# Patient Record
Sex: Male | Born: 1995 | Race: White | Hispanic: No | Smoking: Former smoker
Health system: Southern US, Community
[De-identification: ages and names within clinical notes are randomized; demographics above are authoritative.]

## PROBLEM LIST (undated history)

## (undated) HISTORY — PX: WISDOM TOOTH EXTRACTION: SHX21

---

## 2014-10-28 ENCOUNTER — Emergency Department (HOSPITAL_BASED_OUTPATIENT_CLINIC_OR_DEPARTMENT_OTHER)
Admission: EM | Admit: 2014-10-28 | Discharge: 2014-10-29 | Disposition: A | Discharge status: Home / Self Care | Payer: Worker's Compensation | Attending: Emergency Medicine | Admitting: Emergency Medicine

## 2014-10-28 ENCOUNTER — Encounter (HOSPITAL_BASED_OUTPATIENT_CLINIC_OR_DEPARTMENT_OTHER): Payer: Self-pay | Admitting: *Deleted

## 2014-10-28 ENCOUNTER — Emergency Department (HOSPITAL_BASED_OUTPATIENT_CLINIC_OR_DEPARTMENT_OTHER): Payer: Worker's Compensation

## 2014-10-28 DIAGNOSIS — S29019A Strain of muscle and tendon of unspecified wall of thorax, initial encounter: Secondary | ICD-10-CM | POA: Insufficient documentation

## 2014-10-28 DIAGNOSIS — Y990 Civilian activity done for income or pay: Secondary | ICD-10-CM | POA: Insufficient documentation

## 2014-10-28 DIAGNOSIS — Y9389 Activity, other specified: Secondary | ICD-10-CM | POA: Diagnosis not present

## 2014-10-28 DIAGNOSIS — X58XXXA Exposure to other specified factors, initial encounter: Secondary | ICD-10-CM | POA: Insufficient documentation

## 2014-10-28 DIAGNOSIS — S4992XA Unspecified injury of left shoulder and upper arm, initial encounter: Secondary | ICD-10-CM | POA: Diagnosis present

## 2014-10-28 DIAGNOSIS — Y9289 Other specified places as the place of occurrence of the external cause: Secondary | ICD-10-CM | POA: Diagnosis not present

## 2014-10-28 DIAGNOSIS — Z79899 Other long term (current) drug therapy: Secondary | ICD-10-CM | POA: Insufficient documentation

## 2014-10-28 DIAGNOSIS — Z720 Tobacco use: Secondary | ICD-10-CM | POA: Diagnosis not present

## 2014-10-28 NOTE — ED Notes (Signed)
Pt was moving a heavy object tonight and his helper dropped his end causing th ept to be pulled back. Pt now has pain in his left shoulder and left upper back.

## 2014-10-28 NOTE — ED Notes (Signed)
UDS completed 

## 2014-10-29 ENCOUNTER — Emergency Department (HOSPITAL_BASED_OUTPATIENT_CLINIC_OR_DEPARTMENT_OTHER): Payer: Worker's Compensation

## 2014-10-29 MED ORDER — HYDROCODONE-ACETAMINOPHEN 5-325 MG PO TABS: 1.0000 | ORAL_TABLET | Freq: Four times a day (QID) | ORAL | Status: DC | PRN

## 2014-10-29 NOTE — ED Notes (Signed)
Patient transported to X-ray 

## 2014-10-29 NOTE — ED Notes (Signed)
MD at bedside. 

## 2014-10-29 NOTE — ED Provider Notes (Signed)
CSN: [id number]     Arrival date & time 10/28/14  2301 History   First MD Initiated Contact with Patient 10/29/14 0159     Chief Complaint  Patient presents with  . Shoulder Injury     (Consider location/radiation/quality/duration/timing/severity/associated sxs/prior Treatment) HPI  This is a 19 year old male who was at work yesterday evening about 9:15 PM. He was caring a heavy roll of paper on his left shoulder. The person behind him dropped his end causing the rolled come down forcefully. He felt up sudden pain in his left mid back. He now has focal pain in that region, worse with movement or deep breathing. He rates as a 2 out of 10 at rest but a 7 out of 10 when he moves or coughs. It is also worse when he turns his head to the right. He describes the pain is sharp and stabbing. He denies other injury.  History reviewed. No pertinent past medical history. Past Surgical History  Procedure Laterality Date  . Wisdom tooth extraction     No family history on file. History  Substance Use Topics  . Smoking status: Current Every Day Smoker  . Smokeless tobacco: Not on file  . Alcohol Use: No    Review of Systems  All other systems reviewed and are negative.   Allergies  Review of patient's allergies indicates no known allergies.  Home Medications   Prior to Admission medications   Medication Sig Start Date End Date Taking? Authorizing Provider  loratadine (CLARITIN) 10 MG tablet Take 10 mg by mouth daily.   Yes Historical Provider, MD   BP 139/67 mmHg  Pulse 64  Temp(Src) 98.3 F (36.8 C) (Oral)  Resp 16  Ht 6\' 2"  (1.88 m)  Wt 170 lb (77.111 kg)  BMI 21.82 kg/m2  SpO2 100%   Physical Exam General: Well-developed, well-nourished male in no acute distress; appearance consistent with age of record HENT: normocephalic; atraumatic Eyes: pupils equal, round and reactive to light; extraocular muscles intact Neck: supple; nontender; left posterior rib pain exacerbated by  rotation of head to right Heart: regular rate and rhythm Lungs: clear to auscultation bilaterally Chest: Left posterior mid thoracic rib tenderness without crepitus or deformity Abdomen: soft; nondistended Extremities: No deformity; full range of motion Neurologic: Awake, alert and oriented; motor function intact in all extremities and symmetric; no facial droop Skin: Warm and dry Psychiatric: Normal mood and affect    ED Course  Procedures (including critical care time)   MDM  Nursing notes and vitals signs, including pulse oximetry, reviewed.  Summary of this visit's results, reviewed by myself:  Imaging Studies: Dg Ribs Unilateral W/chest Left  10/29/2014   CLINICAL DATA:  Left mid posterior rib pain after injury. Pain radiates to left scapula.  EXAM: LEFT RIBS AND CHEST - 3+ VIEW  COMPARISON:  None.  FINDINGS: The cortical margins of the left ribs are intact. No fracture or destructive rib lesion. Both lungs are clear. The scapula where included is normal. No consolidation, pleural effusion or pneumothorax.  IMPRESSION: Intact left ribs, no fracture.   Electronically Signed   By: Rubye OaksMelanie  Ehinger M.D.   On: 10/29/2014 02:30   Dg Shoulder Left  10/29/2014   CLINICAL DATA:  Left shoulder pain after injury earlier this day. Pain in left shoulder radiates to left scapula.  EXAM: LEFT SHOULDER - 2+ VIEW  COMPARISON:  None.  FINDINGS: No fracture or dislocation. The alignment and joint spaces are maintained. Bone mineralization is normal. No  focal soft tissue abnormality.  IMPRESSION: Normal left shoulder radiographs.   Electronically Signed   By: Rubye Oaks M.D.   On: 10/29/2014 00:17       Paula Libra, MD 10/29/14 [phone]

## 2016-10-02 IMAGING — CR DG RIBS W/ CHEST 3+V*L*
3 series · 3 of 3 positions shown · non-contrast
Comparison: None.

CLINICAL DATA: Left mid posterior rib pain after injury. Pain
radiates to left scapula.

EXAM:
LEFT RIBS AND CHEST - 3+ VIEW

[w chest pa]
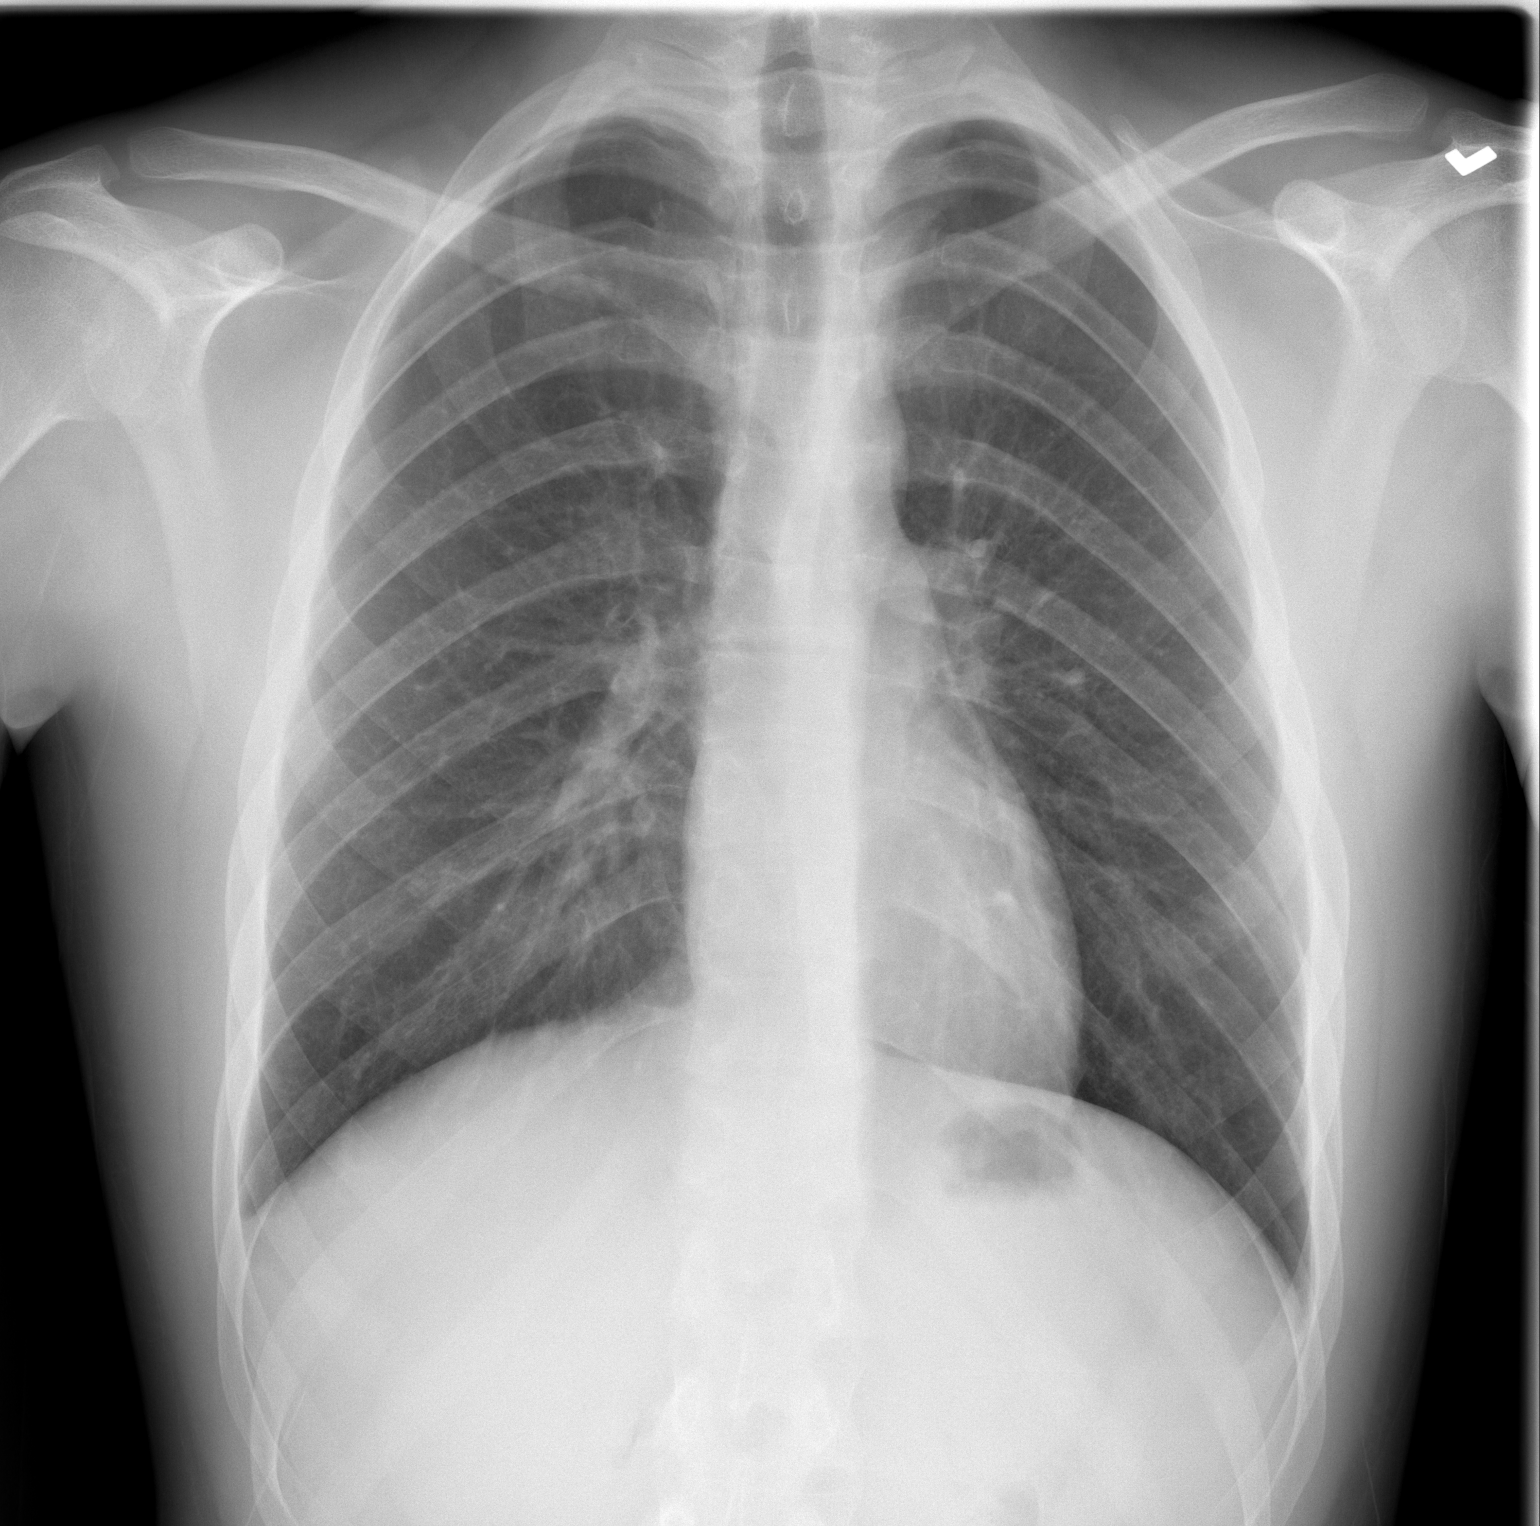

[w ribs ap/pa upper left]
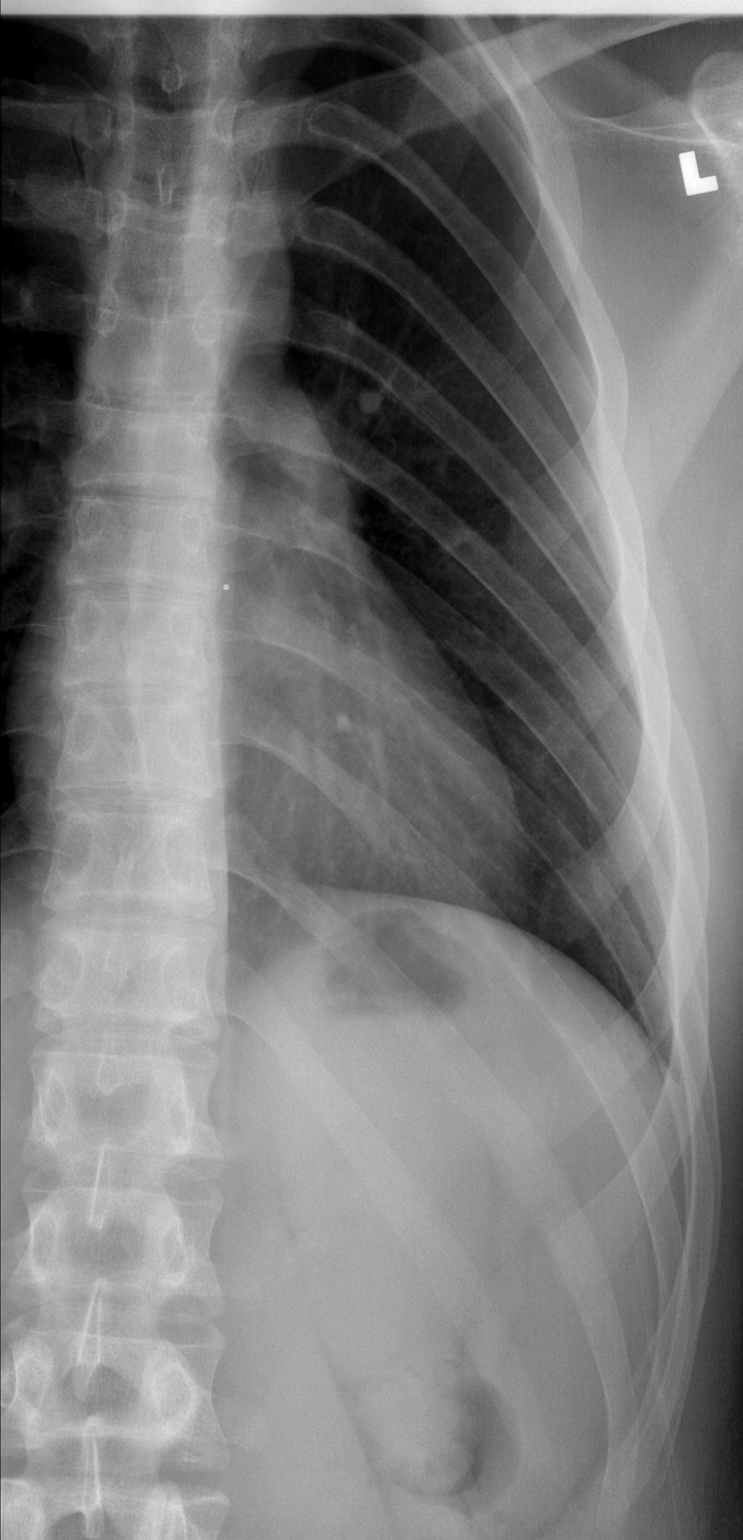

[w ribs oblique left]
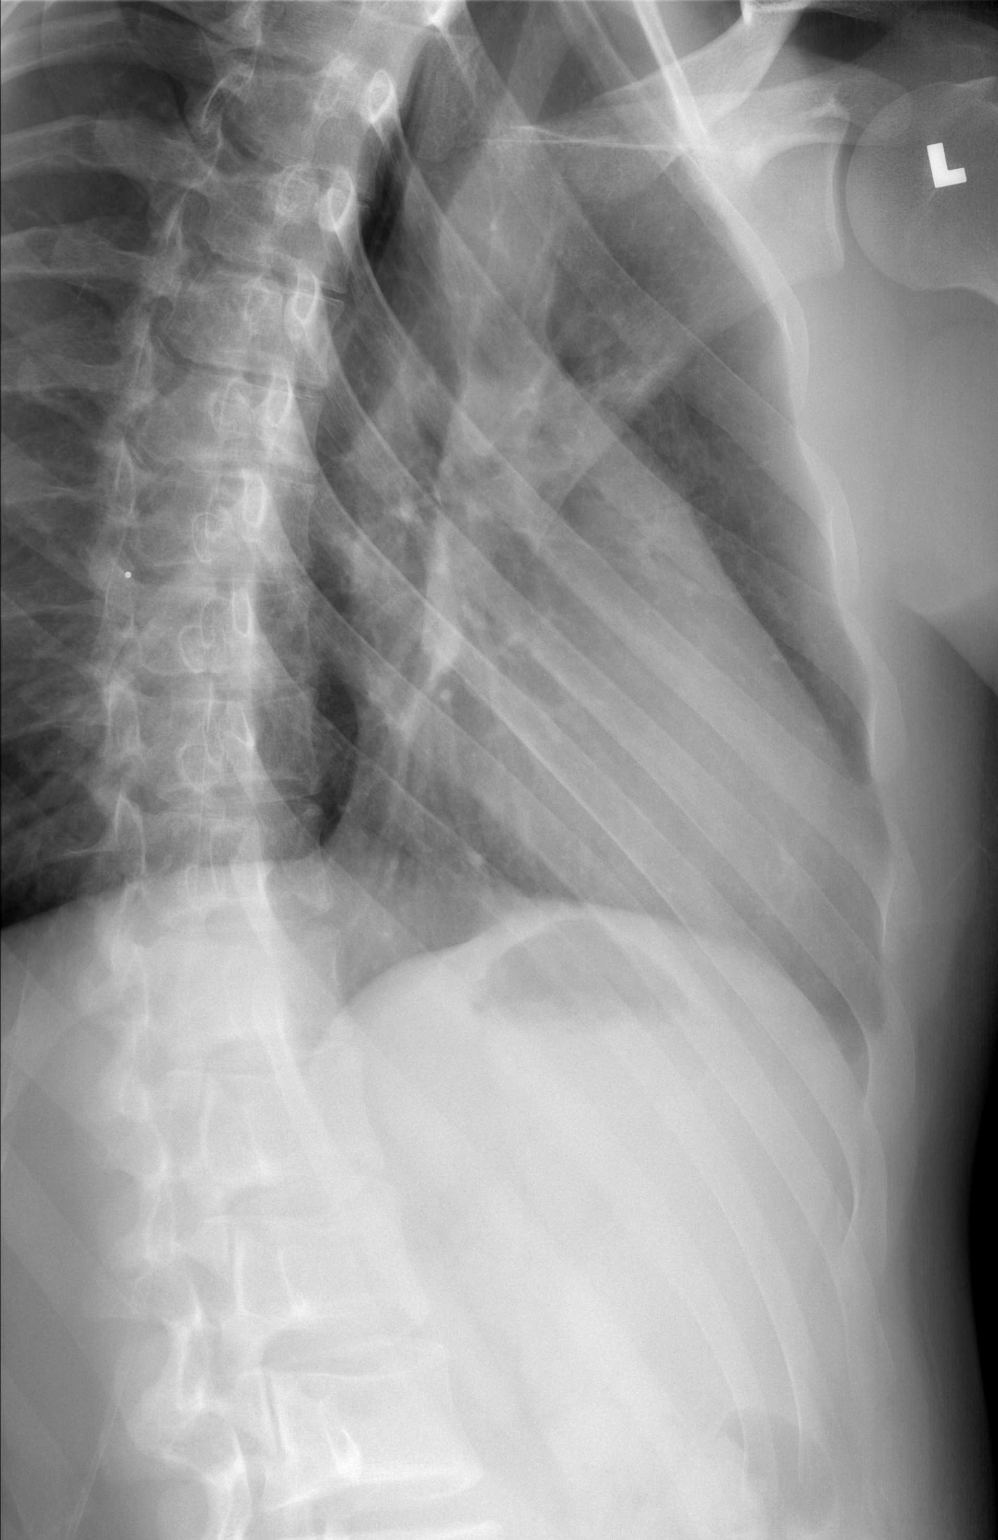

[3 of 3 positions shown; findings below may reference images not displayed]

FINDINGS: The cortical margins of the left ribs are intact. No fracture or
destructive rib lesion. Both lungs are clear. The scapula where
included is normal. No consolidation, pleural effusion or
pneumothorax.
IMPRESSION: Intact left ribs, no fracture.

## 2017-03-10 ENCOUNTER — Emergency Department (HOSPITAL_BASED_OUTPATIENT_CLINIC_OR_DEPARTMENT_OTHER)
Admission: EM | Admit: 2017-03-10 | Discharge: 2017-03-10 | Disposition: A | Discharge status: Home / Self Care | Payer: Worker's Compensation | Attending: Emergency Medicine | Admitting: Emergency Medicine

## 2017-03-10 ENCOUNTER — Encounter (HOSPITAL_BASED_OUTPATIENT_CLINIC_OR_DEPARTMENT_OTHER): Payer: Self-pay

## 2017-03-10 ENCOUNTER — Emergency Department (HOSPITAL_BASED_OUTPATIENT_CLINIC_OR_DEPARTMENT_OTHER): Payer: Worker's Compensation

## 2017-03-10 DIAGNOSIS — S61411A Laceration without foreign body of right hand, initial encounter: Secondary | ICD-10-CM | POA: Diagnosis not present

## 2017-03-10 DIAGNOSIS — Y929 Unspecified place or not applicable: Secondary | ICD-10-CM | POA: Diagnosis not present

## 2017-03-10 DIAGNOSIS — Y990 Civilian activity done for income or pay: Secondary | ICD-10-CM | POA: Diagnosis not present

## 2017-03-10 DIAGNOSIS — F17220 Nicotine dependence, chewing tobacco, uncomplicated: Secondary | ICD-10-CM | POA: Diagnosis not present

## 2017-03-10 DIAGNOSIS — Y939 Activity, unspecified: Secondary | ICD-10-CM | POA: Diagnosis not present

## 2017-03-10 DIAGNOSIS — W231XXA Caught, crushed, jammed, or pinched between stationary objects, initial encounter: Secondary | ICD-10-CM | POA: Diagnosis not present

## 2017-03-10 DIAGNOSIS — S6991XA Unspecified injury of right wrist, hand and finger(s), initial encounter: Secondary | ICD-10-CM | POA: Diagnosis present

## 2017-03-10 MED ORDER — LIDOCAINE HCL 2 % IJ SOLN
10.0000 mL | Freq: Once | INTRAMUSCULAR | Status: AC
  Administered 2017-03-10: 200 mg
  Filled 2017-03-10: qty 20

## 2017-03-10 NOTE — ED Triage Notes (Signed)
Pt c/o crush injury to right hand at work approx 4pm-lac to thumb and web/abrasion with swelling to knuckle-NAD-steady gait

## 2017-03-10 NOTE — Discharge Instructions (Signed)
Please have her sutures removed in 7 days. Please keep your wound covered for the first 24 hours with a bandage. After 24 hours, you can remove the bandage and keep the area clean with warm soap and water. It also apply a topical antibiotic like bacitracin or Neosporin to the wound. Please keep the stitches covered with a bandage while your work or if there is any chance that the wound can get dirty.   If the wound becomes red, hot, swollen, or if you develop fever chills, please return to the emergency department for re-evaluation.

## 2017-03-10 NOTE — ED Notes (Signed)
ED Provider at bedside suturing hand.

## 2017-03-10 NOTE — ED Provider Notes (Signed)
[facility]   CSN: [id number] Arrival date & time: 03/10/17  1705     History   Chief Complaint Chief Complaint  Patient presents with  . Hand Injury    HPI Karl Medina is a [age] y.o. male who presents to the emergency department complaining of right hand pain that began suddenly at 16:00 while he was at work. He reports his hand was crushed between two pallets. No h/o of previous injuries. The patient is right-hand dominant. No numbness or weakness. He reports he tetanus is UTD. No pertinent PMH.   The history is provided by the patient. No language interpreter was used.    History reviewed. No pertinent past medical history.  There are no active problems to display for this patient.   Past Surgical History:  Procedure Laterality Date  . WISDOM TOOTH EXTRACTION         Home Medications    Prior to Admission medications   Not on File    Family History No family history on file.  Social History Social History  Substance Use Topics  . Smoking status: Former Games developermoker  . Smokeless tobacco: Current User    Types: Chew  . Alcohol use Yes     Comment: occ     Allergies   Patient has no known allergies.   Review of Systems Review of Systems  Skin: Positive for wound.  Allergic/Immunologic: Negative for immunocompromised state.  Neurological: Negative for weakness and numbness.     Physical Exam Updated Vital Signs BP 131/82 (BP Location: Left Arm)   Pulse 78   Temp 98.3 F (36.8 C) (Oral)   Resp 20   Ht 6' (1.829 m)   Wt 98 kg (216 lb)   SpO2 100%   BMI 29.29 kg/m   Physical Exam  Constitutional: He appears well-developed.  HENT:  Head: Normocephalic.  Eyes: Conjunctivae are normal.  Neck: Neck supple.  Cardiovascular: Normal rate and regular rhythm.   No murmur heard. Pulmonary/Chest: Effort normal.  Abdominal: Soft. He exhibits no distension.  Musculoskeletal:  Good strength against resistance of all  digits of the right hand. Good abduction and adduction, flexion, and extension of all digits. Sensation is intact throughout. Radial pulses 2+ bilaterally.   Neurological: He is alert.  Skin: Skin is warm and dry.  0.5 cm straight hemostatic laceration to the right hand on the lateral aspects at the base of the second digit on the palmar aspect. There is a superficial skin tear to the right thumb just inferior to the nail bed. No obvious foreign bodies.  Psychiatric: His behavior is normal.  Nursing note and vitals reviewed.    ED Treatments / Results  Labs (all labs ordered are listed, but only abnormal results are displayed) Labs Reviewed - No data to display  EKG  EKG Interpretation None       Radiology Dg Hand Complete Right  Result Date: 03/10/2017 CLINICAL DATA:  Right hand pain, crush injury EXAM: RIGHT HAND - COMPLETE 3+ VIEW COMPARISON:  None. FINDINGS: No fracture or dislocation is seen. The joint spaces are preserved. Visualized soft tissues are within normal limits. IMPRESSION: Negative. Electronically Signed   By: Charline BillsSriyesh  Krishnan M.D.   On: 03/10/2017 17:36    Procedures .Marland Kitchen.Laceration Repair Date/Time: 03/10/2017 7:30 PM Performed by: Lilian KapurMCDONALD, MIA A Authorized by: Frederik PearMCDONALD, MIA A   Consent:    Consent obtained:  Verbal   Consent given by:  Patient   Risks discussed:  Infection, pain, nerve damage, vascular damage and tendon damage   Alternatives discussed:  No treatment Anesthesia (see MAR for exact dosages):    Anesthesia method:  Local infiltration   Local anesthetic:  Lidocaine 2% w/o epi Laceration details:    Location: right hand    Length (cm):  0.5 Repair type:    Repair type:  Simple Pre-procedure details:    Preparation:  Patient was prepped and draped in usual sterile fashion and imaging obtained to evaluate for foreign bodies Exploration:    Hemostasis achieved with:  Direct pressure   Wound exploration: wound explored through full range of  motion and entire depth of wound probed and visualized     Wound extent: no fascia violation noted, no foreign bodies/material noted, no muscle damage noted, no nerve damage noted, no tendon damage noted, no underlying fracture noted and no vascular damage noted     Contaminated: no   Treatment:    Area cleansed with:  Betadine and saline   Amount of cleaning:  Standard   Irrigation solution:  Sterile saline   Irrigation method:  Pressure wash and syringe Skin repair:    Repair method:  Sutures   Suture size:  4-0   Suture material:  Prolene   Suture technique:  Simple interrupted (1 simple interrupted; 1 horizontal mattress )   Number of sutures:  2 Approximation:    Approximation:  Close Post-procedure details:    Dressing:  Antibiotic ointment, sterile dressing and adhesive bandage   Patient tolerance of procedure:  Tolerated well, no immediate complications   (including critical care time)  Medications Ordered in ED Medications  lidocaine (XYLOCAINE) 2 % (with pres) injection 200 mg (200 mg Infiltration Given 03/10/17 1900)     Initial Impression / Assessment and Plan / ED Course  I have reviewed the triage vital signs and the nursing notes.  Pertinent labs & imaging results that were available during my care of the patient were reviewed by me and considered in my medical decision making (see chart for details).     Pressure irrigation performed. Laceration occurred < 8 hours prior to repair which was well tolerated. Pt has no co morbidities to effect normal wound healing. Discussed suture home care w pt and answered questions. Pt to f-u for wound check and suture removal in 7 days. Pt is hemodynamically stable w no complaints prior to dc.     Final Clinical Impressions(s) / ED Diagnoses   Final diagnoses:  Laceration of right hand without foreign body, initial encounter    New Prescriptions New Prescriptions   No medications on file     Barkley BoardsMcDonald, Mia A,  PA-C 03/10/17 1933    Shaune PollackIsaacs, Cameron, MD 03/11/17 1622

## 2019-02-12 IMAGING — CR DG HAND COMPLETE 3+V*R*
3 series · 3 of 3 positions shown · non-contrast
Comparison: None.

CLINICAL DATA: Right hand pain, crush injury

EXAM:
RIGHT HAND - COMPLETE 3+ VIEW

[x hand pa right]
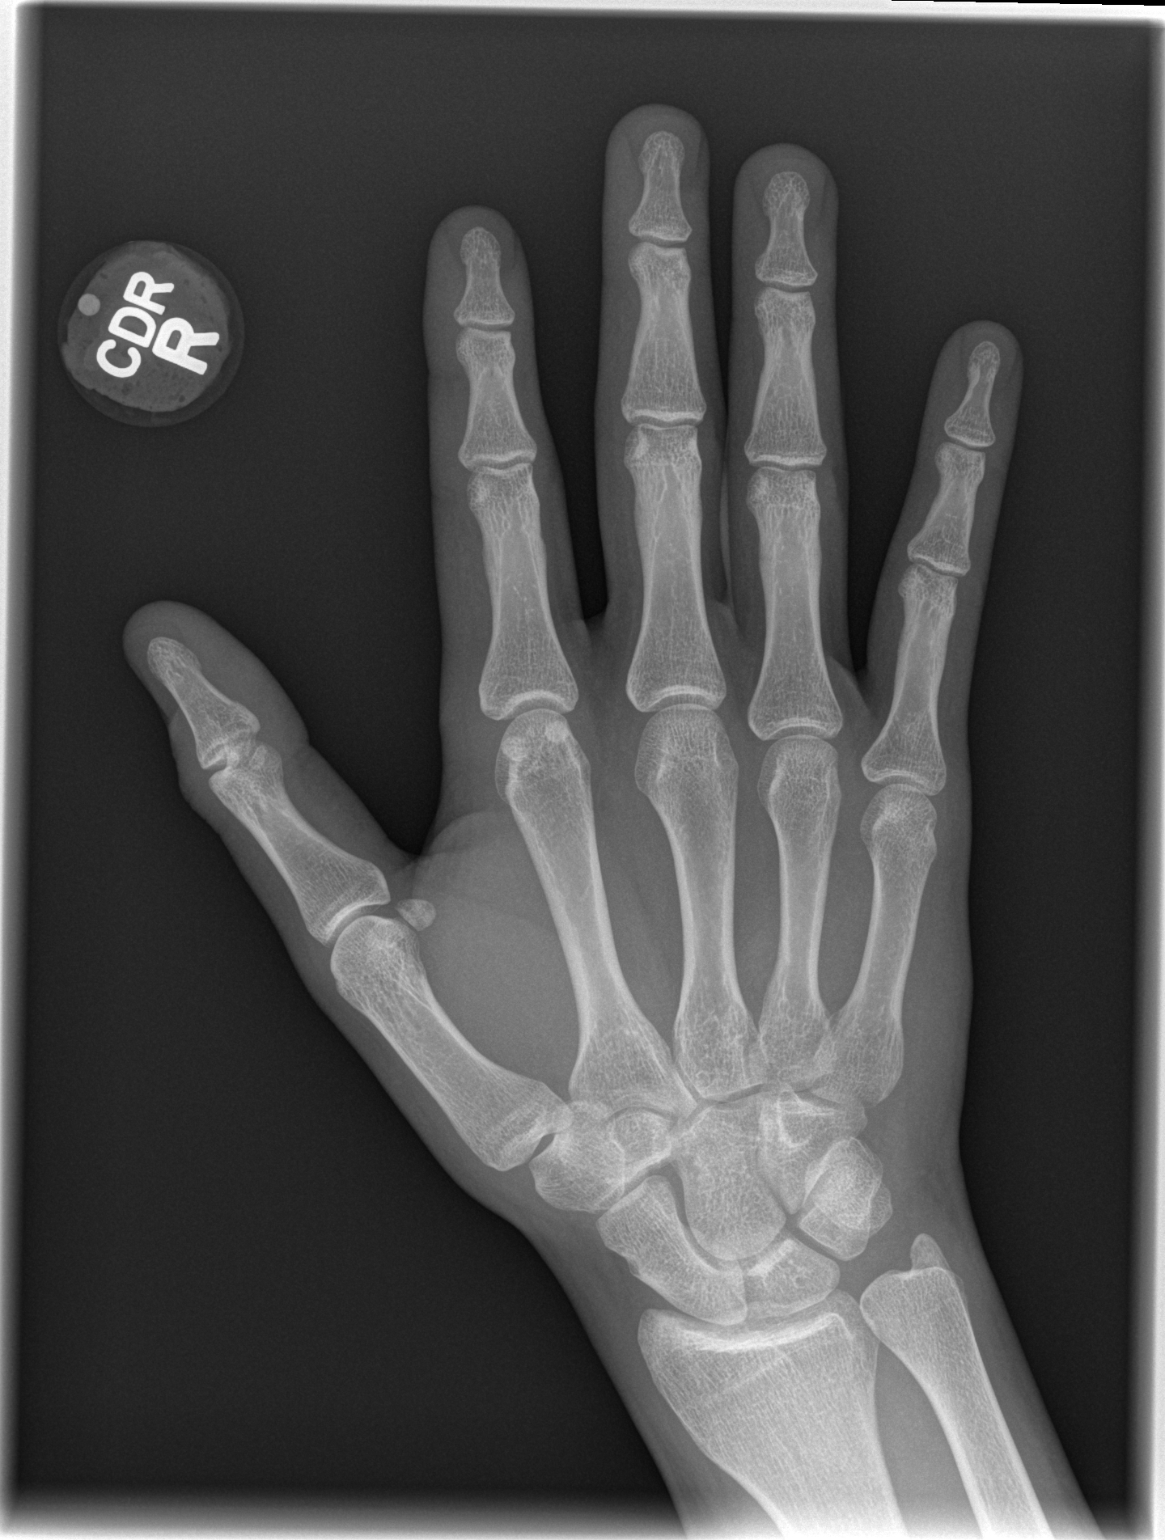

[x hand oblique right]
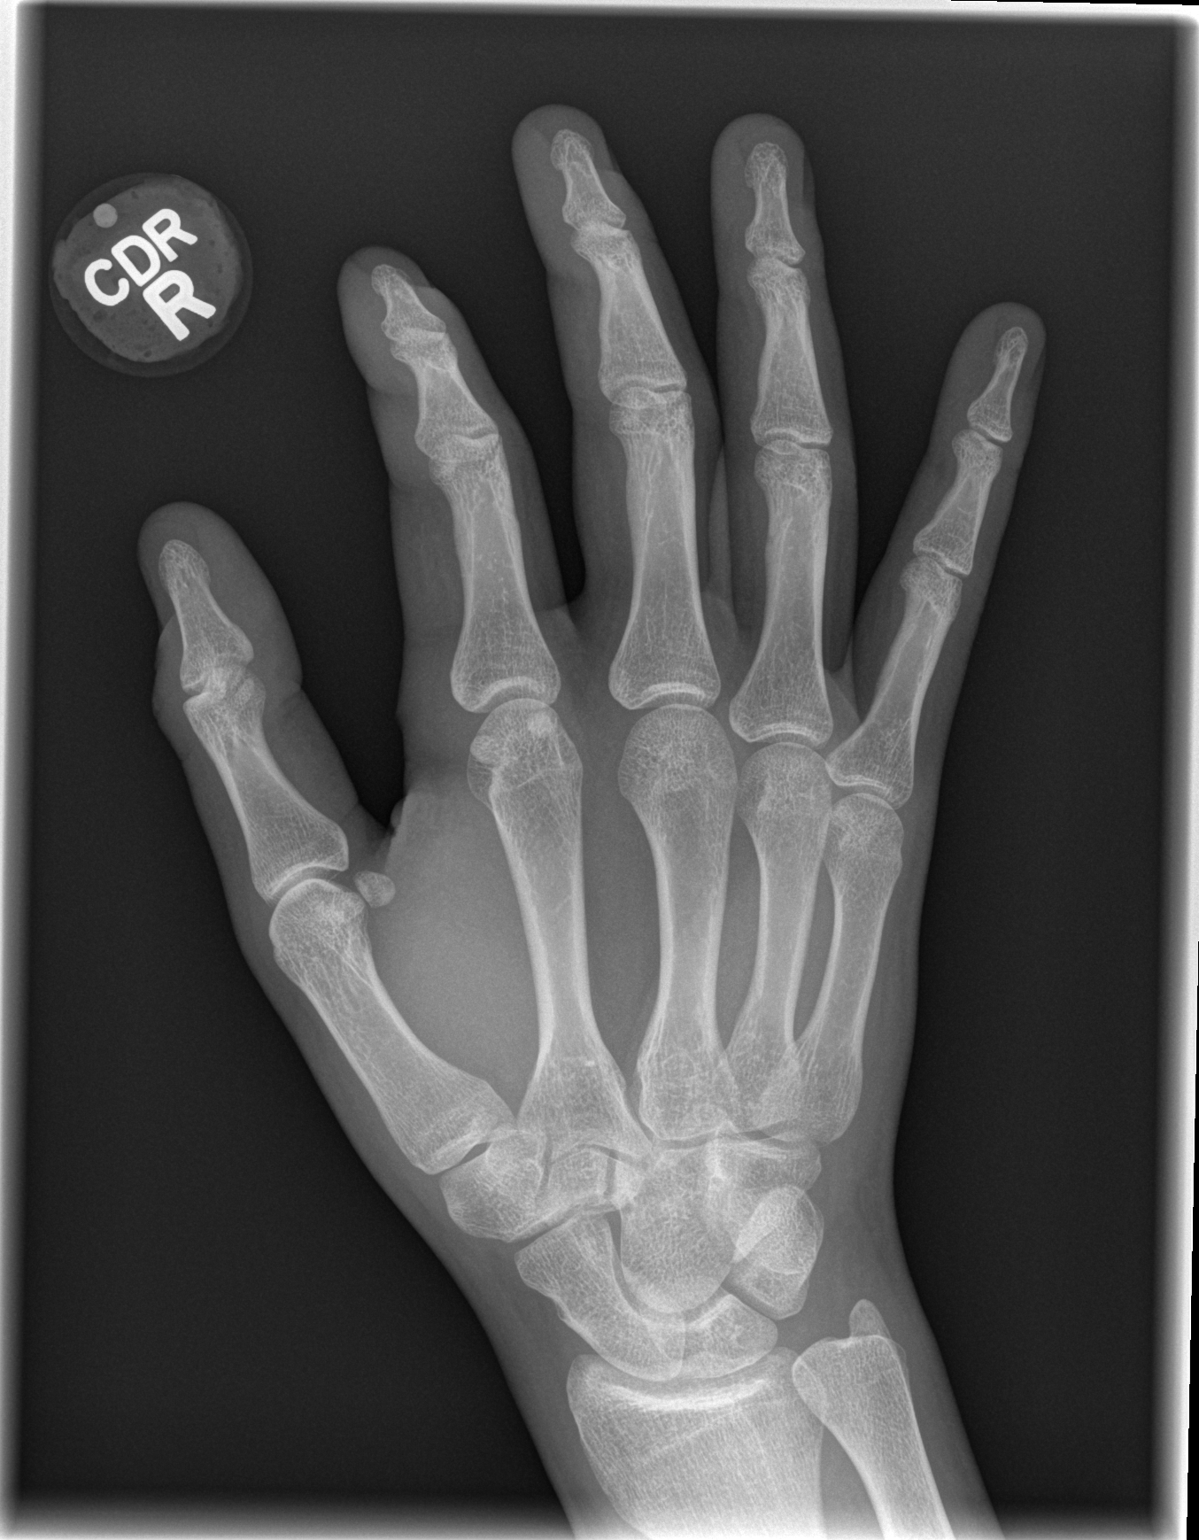

[x hand lat right]
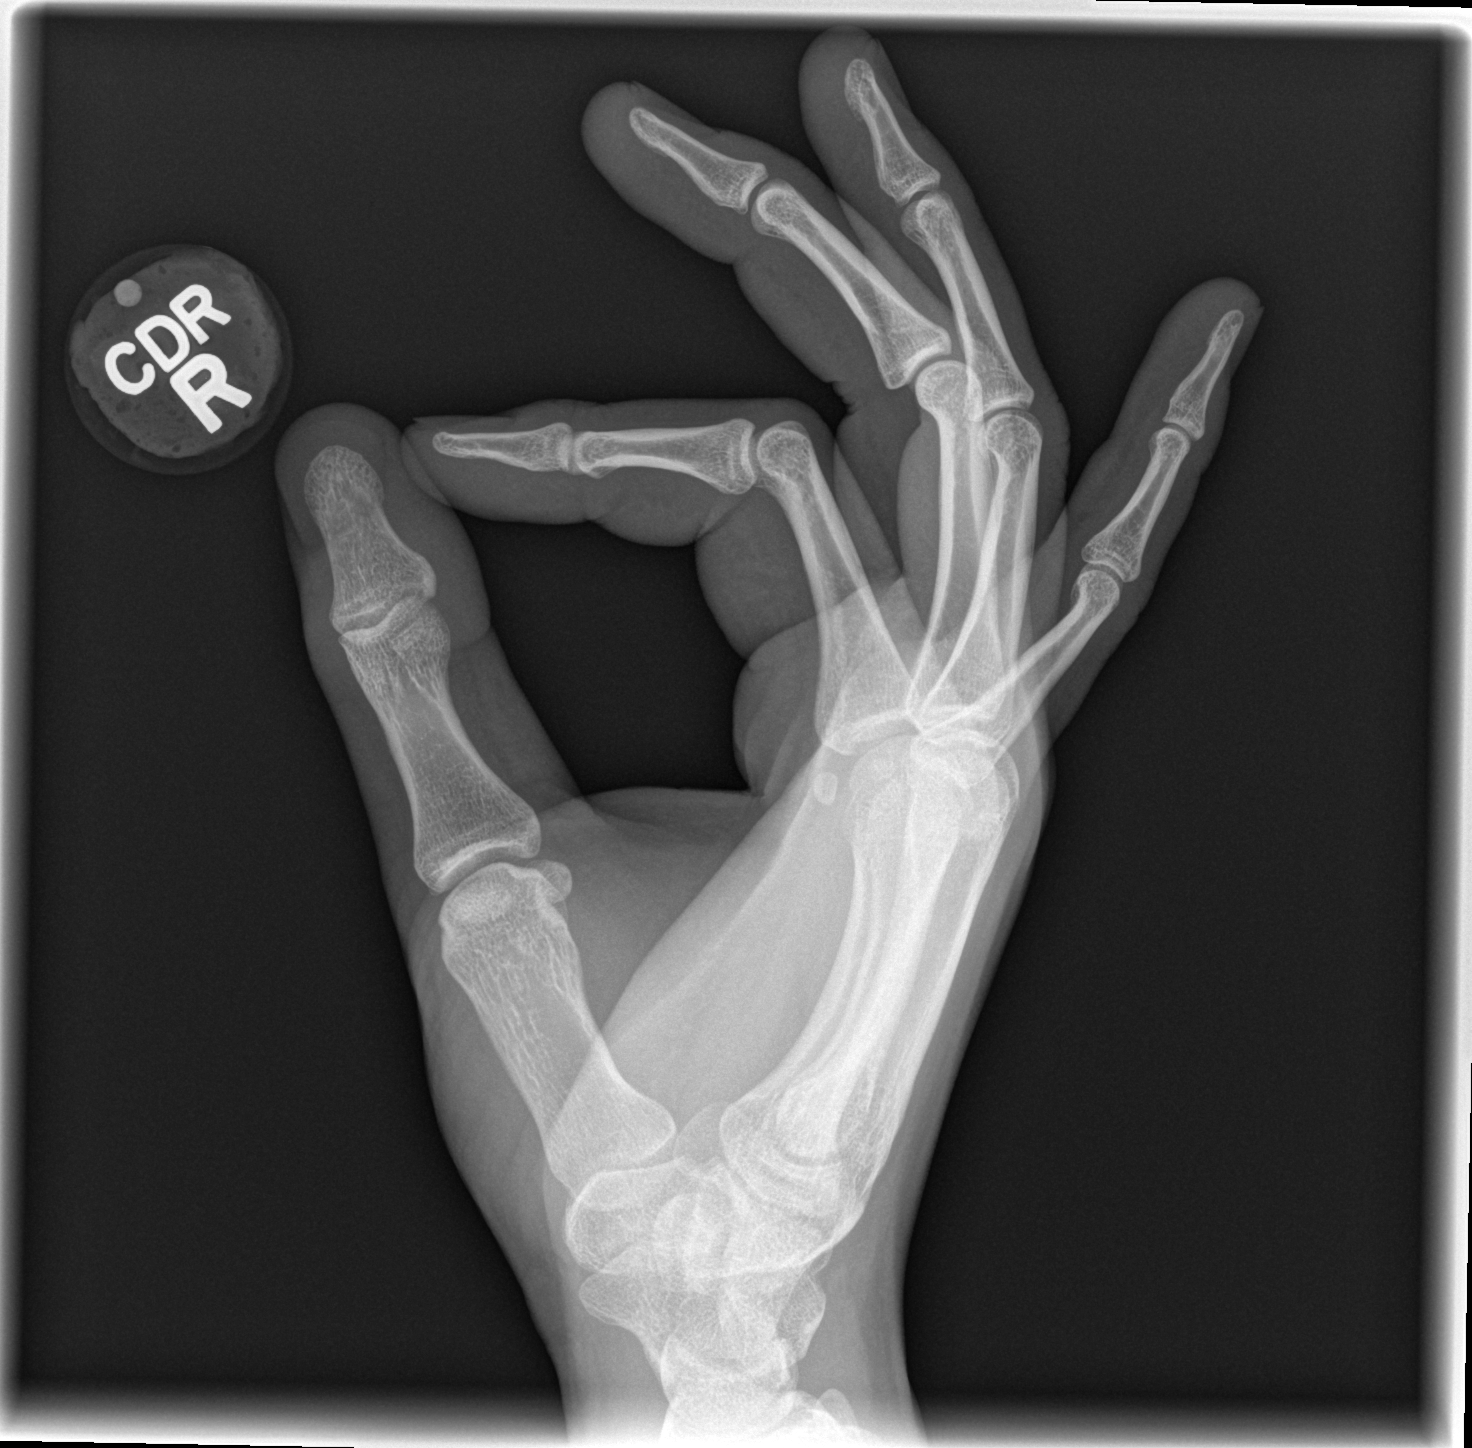

[3 of 3 positions shown; findings below may reference images not displayed]

FINDINGS: No fracture or dislocation is seen.

The joint spaces are preserved.

Visualized soft tissues are within normal limits.
IMPRESSION: Negative.

## 2024-04-26 ENCOUNTER — Other Ambulatory Visit: Payer: Self-pay

## 2024-04-26 ENCOUNTER — Emergency Department (HOSPITAL_BASED_OUTPATIENT_CLINIC_OR_DEPARTMENT_OTHER)
Admission: EM | Admit: 2024-04-26 | Discharge: 2024-04-26 | Disposition: A | Discharge status: Home / Self Care | Payer: Worker's Compensation | Attending: Emergency Medicine | Admitting: Emergency Medicine

## 2024-04-26 DIAGNOSIS — W228XXA Striking against or struck by other objects, initial encounter: Secondary | ICD-10-CM | POA: Diagnosis not present

## 2024-04-26 DIAGNOSIS — Y990 Civilian activity done for income or pay: Secondary | ICD-10-CM | POA: Insufficient documentation

## 2024-04-26 DIAGNOSIS — S0101XA Laceration without foreign body of scalp, initial encounter: Secondary | ICD-10-CM | POA: Insufficient documentation

## 2024-04-26 DIAGNOSIS — S0990XA Unspecified injury of head, initial encounter: Secondary | ICD-10-CM | POA: Diagnosis present

## 2024-04-26 DIAGNOSIS — Z87891 Personal history of nicotine dependence: Secondary | ICD-10-CM | POA: Diagnosis not present

## 2024-04-26 MED ORDER — LIDOCAINE-EPINEPHRINE (PF) 2 %-1:200000 IJ SOLN
10.0000 mL | Freq: Once | INTRAMUSCULAR | Status: DC
  Filled 2024-04-26: qty 20
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
## 2024-04-26 NOTE — ED Notes (Signed)
 Reviewed AVS/discharge instruction with patient. Time allotted for and all questions answered. Patient is agreeable for d/c and escorted to ed exit by staff.

## 2024-04-26 NOTE — Discharge Instructions (Signed)
 Your laceration was repaired using 2 absorbable sutures.  They should dissolve in the next 7 to 10 days.  You may take Tylenol , ibuprofen for any headache.  Wash area gently with warm soapy water at least once daily.  Avoid hair products on the area until it is healed.  Recommend follow-up with primary care for reassessment.
